# Patient Record
Sex: Male | Born: 1945 | Race: White | Hispanic: No
Health system: Southern US, Community
[De-identification: ages and names within clinical notes are randomized; demographics above are authoritative.]

## PROBLEM LIST (undated history)

## (undated) DIAGNOSIS — F1910 Other psychoactive substance abuse, uncomplicated: Secondary | ICD-10-CM

## (undated) DIAGNOSIS — I10 Essential (primary) hypertension: Secondary | ICD-10-CM

## (undated) DIAGNOSIS — J690 Pneumonitis due to inhalation of food and vomit: Principal | ICD-10-CM

---

## 2016-07-20 DIAGNOSIS — J690 Pneumonitis due to inhalation of food and vomit: Secondary | ICD-10-CM | POA: Diagnosis present

## 2016-07-20 HISTORY — DX: Pneumonitis due to inhalation of food and vomit: J69.0

## 2016-08-16 HISTORY — PX: PICC LINE PLACE PERIPHERAL (ARMC HX): HXRAD1248

## 2016-09-08 ENCOUNTER — Encounter: Payer: Self-pay | Admitting: Physician Assistant

## 2016-09-08 ENCOUNTER — Inpatient Hospital Stay
Admission: RE | Admit: 2016-09-08 | Discharge: 2016-10-11 | Disposition: A | Discharge status: Home Health | Payer: Self-pay | Attending: Internal Medicine | Admitting: Internal Medicine

## 2016-09-08 ENCOUNTER — Other Ambulatory Visit (HOSPITAL_COMMUNITY): Payer: Self-pay

## 2016-09-08 DIAGNOSIS — F1910 Other psychoactive substance abuse, uncomplicated: Secondary | ICD-10-CM | POA: Diagnosis present

## 2016-09-08 DIAGNOSIS — Z0189 Encounter for other specified special examinations: Secondary | ICD-10-CM

## 2016-09-08 DIAGNOSIS — I10 Essential (primary) hypertension: Secondary | ICD-10-CM | POA: Diagnosis present

## 2016-09-08 DIAGNOSIS — R001 Bradycardia, unspecified: Secondary | ICD-10-CM | POA: Diagnosis present

## 2016-09-08 DIAGNOSIS — R1310 Dysphagia, unspecified: Secondary | ICD-10-CM

## 2016-09-08 DIAGNOSIS — Z431 Encounter for attention to gastrostomy: Secondary | ICD-10-CM

## 2016-09-08 DIAGNOSIS — J9690 Respiratory failure, unspecified, unspecified whether with hypoxia or hypercapnia: Secondary | ICD-10-CM

## 2016-09-08 DIAGNOSIS — J690 Pneumonitis due to inhalation of food and vomit: Secondary | ICD-10-CM | POA: Diagnosis present

## 2016-09-08 HISTORY — DX: Pneumonitis due to inhalation of food and vomit: J69.0

## 2016-09-08 HISTORY — DX: Essential (primary) hypertension: I10

## 2016-09-08 HISTORY — DX: Other psychoactive substance abuse, uncomplicated: F19.10

## 2016-09-08 LAB — URINALYSIS, ROUTINE W REFLEX MICROSCOPIC
Bilirubin Urine: NEGATIVE
Glucose, UA: NEGATIVE mg/dL
Hgb urine dipstick: NEGATIVE
Ketones, ur: NEGATIVE mg/dL
Leukocytes, UA: NEGATIVE
Nitrite: NEGATIVE
PROTEIN: 30 mg/dL — AB
SQUAMOUS EPITHELIAL / LPF: NONE SEEN
Specific Gravity, Urine: 1.02 (ref 1.005–1.030)
pH: 5 (ref 5.0–8.0)

## 2016-09-08 LAB — BLOOD GAS, ARTERIAL
Acid-base deficit: 0.7 mmol/L (ref 0.0–2.0)
BICARBONATE: 23.5 mmol/L (ref 20.0–28.0)
Drawn by: 244851
FIO2: 40
LHR: 14 {breaths}/min
O2 SAT: 97 %
PATIENT TEMPERATURE: 98.6
PCO2 ART: 39.4 mmHg (ref 32.0–48.0)
PEEP: 5 cmH2O
PH ART: 7.393 (ref 7.350–7.450)
PO2 ART: 93.6 mmHg (ref 83.0–108.0)
VT: 500 mL

## 2016-09-08 LAB — VANCOMYCIN, RANDOM: VANCOMYCIN RM: 25

## 2016-09-08 NOTE — Consult Note (Signed)
CARDIOLOGY CONSULT NOTE   Patient ID: Samuel MantisCarl Baldwin MRN: [id number] DOB/AGE: 70/09/1945 70 y.o.  Admit date: 09/08/2016  Primary Physician   Pcp Not In System Primary Cardiologist   None Reason for Consultation   Bradycardia Requesting MD: Dr Sharyon MedicusHijazi  YQM:VHQIHPI:Samuel Baldwin is a 70 y.o. year old male with a history of HTN  [facility]/27-12/21 for acute resp failure, CAP, polysubstance abuse, delirium and acute renal failure.   Pt had been on methadone and Percocet. A friend called EMS for altered mental status. Pt was initially combative and agitated. UDS + for methamphetamine, amphetamines, THC, and benzodiazepines. He has also had jimson weed. He required intubation for airway protection and respiratory failure.  He had SVT with multiple doses of Adenosine. CK 528, LLL PNA on CXR. His heart rate eventually slowed and he was felt to have atrial tachycardia.   On mechanical ventilation, he was difficult to sedate, switched from propofol to Versed. He had bradycardia with Precedex (twice). Zyprexa was tried to wean his sedation. His EF was normal by echo. He was initially hypotensive, but this improved. He was diuresed, but still with moderately high O2 requirements. UOP was improving, as was renal function. He had bronchoscopy for mucous plugging, with improvement.  His mental status did not improve quickly, he was felt to have a reaction to jimson weed.   He had junctional bradycardia, felt to be sedation-related. He was extubated for a time, and his heart rate improved to >60. However, he had to be re-intubated, and the junctional bradycardia returned. Low-dose dopamine was used to maintain HR. Isuprel was considered, but not needed.   He was treated for PNA, but he was cultured when his fever returned. Culture was + MRSA. PEG not placed due to the MRSA. Cardiology followed Samuel Baldwin at [facility] and did not wish to do a PPM since he was still having fevers at  times. He was maintaining his BP with the bradycardia and his heart rate would improve when off sedation.  [facility]. He is still sedated on the vent and cannot provide information. His heart rate is junctional bradycardia in the 30s at times.   Per cardiology note 12/08, his ECG 11/27 "looks like atrial tachycardia at about 160 bpm with variable AV conduction such that ventricular rate was in the 90s - this is the EKG on Nov 27 at 0132."  While intubated, he had "marked sinus bradycardia with junctional rhythm. His BP was normal to elevated during this time, so it was not hemodynamically significant." no PPM indicated, rebound from prior substance abuse considered.    Past Medical History:  Diagnosis Date  . Aspiration pneumonia (HCC) 07/2016  . HTN (hypertension)   . Polysubstance abuse    methamphetamine, amphetamines, THC, benzodiazepine and jimson week     Past Surgical History:  Procedure Laterality Date  . PICC LINE PLACE PERIPHERAL ([facility] HX)  08/16/2016    Allergies no known allergies  I have reviewed the patient's current medications Insulin per protocol Oxycodone IR 15 MG Q 6 HR PRN Ativan 2 mg IV prn Vancomycin Thiamine 100 mg qd Valium 5 mg bid Colace 100 mg qd Phenobarbital 32.4 mg bid Pepcid 20 mg bid Lasix 20 mg bid Lovenox 40 mg subcu qd  Information on PTA meds, social history, etcetera not available due to pt condition.  Prior to Admission medications   Not known.  Social History   Social History  . Marital status: Significant Other    Spouse name: N/A  . Number of children: N/A  . Years of education: N/A   Occupational History  . Not on file.   Social History Main Topics  . Smoking status: Not on file  . Smokeless tobacco: Not on file  . Alcohol use Not on file  . Drug use: Unknown  . Sexual activity: Not on file   Other Topics Concern  . Not on file   Social History Narrative  . No narrative on file      No family status information on file.   No family history on file. not elicitable from patient and [facility]     Labs: ABG    Component Value Date/Time   PHART 7.393 09/08/2016 1200   PCO2ART 39.4 09/08/2016 1200   PO2ART 93.6 09/08/2016 1200   HCO3 23.5 09/08/2016 1200   ACIDBASEDEF 0.7 09/08/2016 1200   O2SAT 97.0 09/08/2016 1200   Urinalysis    Component Value Date/Time   COLORURINE YELLOW 09/08/2016 1415   APPEARANCEUR HAZY (A) 09/08/2016 1415   LABSPEC 1.020 09/08/2016 1415   PHURINE 5.0 09/08/2016 1415   GLUCOSEU NEGATIVE 09/08/2016 1415   HGBUR NEGATIVE 09/08/2016 1415   BILIRUBINUR NEGATIVE 09/08/2016 1415   KETONESUR NEGATIVE 09/08/2016 1415   PROTEINUR 30 (A) 09/08/2016 1415   NITRITE NEGATIVE 09/08/2016 1415   LEUKOCYTESUR NEGATIVE 09/08/2016 1415     Echo: 08/15/2016 Interpretation Summary A limited portable two-dimensional transthoracic echocardiogram with color Doppler and Spectral Doppler was performed. The left ventricle is normal in size. There is mild concentric left ventricular hypertrophy. The left ventricular wall motion is normal. The left ventricular ejection fraction is normal (55-60%). Moderate aortic sclerosis is present with good valvular opening. The left atrium is mildly dilated. Unable to adequately determine diastolic dysfunction.  ECG:  09/07/2016 Ventricular Rate 36  Atrial Rate 36  P-R Interval 1686  QRS Duration 83  Q-T Interval 579  QTC Calculation(Bezet) 448  Calculated R Axis 66  Calculated T Axis 56   Diagnosis: Junctional rhythm   Cath: n/a  Radiology:  Dg Chest Port 1 View Result Date: 09/08/2016 CLINICAL DATA:  Tracheostomy tube placement EXAM: PORTABLE CHEST 1 VIEW COMPARISON:  None. FINDINGS: Tracheostomy tube with the tip 8 cm above the carina. Nasogastric tube with the distal portion not visualized. Bilateral interstitial thickening. Stable cardiomegaly. No pleural effusion or  pneumothorax. IMPRESSION: 1. Tracheostomy tube in satisfactory position. 2. Cardiomegaly with pulmonary vascular congestion. Electronically Signed   By: Elige KoHetal  Patel   On: 09/08/2016 13:38   Dg Abd Portable 1v Result Date: 09/08/2016 CLINICAL DATA:  NG tube placement EXAM: PORTABLE ABDOMEN - 1 VIEW COMPARISON:  None. FINDINGS: NG tube is in the proximal stomach.  Normal bowel gas pattern. IMPRESSION: NG tube in the proximal stomach. Electronically Signed   By: Marlan Palauharles  Clark M.D.   On: 09/08/2016 13:55    ASSESSMENT AND PLAN:   The patient was seen today by Dr Eden EmmsNishan, the patient evaluated and the data reviewed.   1. Bradycardia: This is not symptomatic notes from [facility] indicate no change with Dopamine and rhythm worse when sedated Given infection and ongoing need for antibiotics as well as altered MS he is not a candidate for PPM. Observe on telemetry For now. Try to minimize sedation check TSH/BMET. He has normal EF and no signs of active ischemia.    Charlton HawsPeter Izaac Reisig

## 2016-09-09 LAB — COMPREHENSIVE METABOLIC PANEL
ALBUMIN: 2.8 g/dL — AB (ref 3.5–5.0)
ALT: 35 U/L (ref 17–63)
AST: 26 U/L (ref 15–41)
Alkaline Phosphatase: 97 U/L (ref 38–126)
Anion gap: 9 (ref 5–15)
BUN: 35 mg/dL — AB (ref 6–20)
CHLORIDE: 104 mmol/L (ref 101–111)
CO2: 26 mmol/L (ref 22–32)
Calcium: 9.2 mg/dL (ref 8.9–10.3)
Creatinine, Ser: 1.02 mg/dL (ref 0.61–1.24)
GFR calc Af Amer: >60 mL/min (ref >60)
GFR calc non Af Amer: >60 mL/min (ref >60)
Glucose, Bld: 264 mg/dL — ABNORMAL HIGH (ref 65–99)
POTASSIUM: 4.1 mmol/L (ref 3.5–5.1)
SODIUM: 139 mmol/L (ref 135–145)
Total Bilirubin: 0.8 mg/dL (ref 0.3–1.2)
Total Protein: 7 g/dL (ref 6.5–8.1)

## 2016-09-09 LAB — PROTIME-INR
INR: 1.19
PROTHROMBIN TIME: 15.2 s (ref 11.4–15.2)

## 2016-09-09 LAB — CBC WITH DIFFERENTIAL/PLATELET
BASOS ABS: 0.1 10*3/uL (ref 0.0–0.1)
BASOS PCT: 1 %
EOS ABS: 0.3 10*3/uL (ref 0.0–0.7)
Eosinophils Relative: 2 %
HEMATOCRIT: 35.5 % — AB (ref 39.0–52.0)
HEMOGLOBIN: 11.9 g/dL — AB (ref 13.0–17.0)
Lymphocytes Relative: 9 %
Lymphs Abs: 1.3 10*3/uL (ref 0.7–4.0)
MCH: 29.8 pg (ref 26.0–34.0)
MCHC: 33.5 g/dL (ref 30.0–36.0)
MCV: 88.8 fL (ref 78.0–100.0)
MONO ABS: 0.8 10*3/uL (ref 0.1–1.0)
Monocytes Relative: 6 %
NEUTROS ABS: 11.4 10*3/uL — AB (ref 1.7–7.7)
NEUTROS PCT: 82 %
Platelets: 234 10*3/uL (ref 150–400)
RBC: 4 MIL/uL — ABNORMAL LOW (ref 4.22–5.81)
RDW: 12.9 % (ref 11.5–15.5)
WBC: 13.8 10*3/uL — ABNORMAL HIGH (ref 4.0–10.5)

## 2016-09-09 LAB — URINE CULTURE: CULTURE: NO GROWTH

## 2016-09-09 LAB — TSH: TSH: 0.366 u[IU]/mL (ref 0.350–4.500)

## 2016-09-09 LAB — PHOSPHORUS: Phosphorus: 2.9 mg/dL (ref 2.5–4.6)

## 2016-09-09 LAB — MAGNESIUM: Magnesium: 2 mg/dL (ref 1.7–2.4)

## 2016-09-09 LAB — VANCOMYCIN, TROUGH: VANCOMYCIN TR: 7 ug/mL — AB (ref 15–20)

## 2016-09-09 NOTE — Progress Notes (Signed)
Patient Name: Samuel Baldwin Date of Encounter: 09/09/2016  Primary Cardiologist: Dr [facility]  Hospital Problem List     Principal Problem:   Aspiration pneumonia [facility](HCC) Active Problems:   Polysubstance abuse   HTN (hypertension)   Bradycardia with 31-40 beats per minute     Subjective   Pt unresponsive on the vent. Agitated much of the time, hard to sedate.  Inpatient Medications    Scheduled Meds:  Continuous Infusions: Dopamine, titrated to keep HR > 60  PRN Meds:  Insulin, K+ and Mg per protocol  Vital Signs    176/86, HR 72, respirations 20, O2 sats 99%; I/O 1120/2200  Physical Exam    GEN: Well nourished, well developed, male agitated on the vent  HEENT: Grossly normal.  Neck: Supple, no JVD seen, carotid bruits, or masses. Cardiac: RRR, no murmurs, rubs, or gallops. No clubbing, cyanosis, edema.  Radials/DP/PT 2+ and equal bilaterally.  Respiratory:  Respirations regular and unlabored, bilateral rales and rhonchi GI: Soft, nontender, nondistended, BS + x 4. MS: no deformity or atrophy. Skin: warm and dry, no rash. Neuro:  Strength intact.  Labs    CBC  Recent Labs  09/09/16 0531  WBC 13.8*  NEUTROABS 11.4*  HGB 11.9*  HCT 35.5*  MCV 88.8  PLT 234   Basic Metabolic Panel  Recent Labs  09/09/16 0531  NA 139  K 4.1  CL 104  CO2 26  GLUCOSE 264*  BUN 35*  CREATININE 1.02  CALCIUM 9.2  MG 2.0  PHOS 2.9   Liver Function Tests  Recent Labs  09/09/16 0531  AST 26  ALT 35  ALKPHOS 97  BILITOT 0.8  PROT 7.0  ALBUMIN 2.8*   Thyroid Function Tests  Recent Labs  09/09/16 0531  TSH 0.366    Telemetry    SR, mild sinus brady - Personally Reviewed  ECG    n/a - Personally Reviewed  Radiology    Dg Chest Port 1 View  Result Date: 09/08/2016 CLINICAL DATA:  Tracheostomy tube placement EXAM: PORTABLE CHEST 1 VIEW COMPARISON:  None. FINDINGS: Tracheostomy tube with the tip 8 cm above the carina. Nasogastric tube with the  distal portion not visualized. Bilateral interstitial thickening. Stable cardiomegaly. No pleural effusion or pneumothorax. IMPRESSION: 1. Tracheostomy tube in satisfactory position. 2. Cardiomegaly with pulmonary vascular congestion. Electronically Signed   By: Samuel KoHetal  Baldwin   On: 09/08/2016 13:38   Dg Abd Portable 1v  Result Date: 09/08/2016 CLINICAL DATA:  NG tube placement EXAM: PORTABLE ABDOMEN - 1 VIEW COMPARISON:  None. FINDINGS: NG tube is in the proximal stomach.  Normal bowel gas pattern. IMPRESSION: NG tube in the proximal stomach. Electronically Signed   By: Samuel Palauharles  Clark M.D.   On: 09/08/2016 13:55    Cardiac Studies   None here  Patient Profile     70 yo male w/ hx HTN and polysubstance abuse [facility] for OD on multiple drugs including jimson weed (felt responsible for his agitation). MRSA PNA, VDRF, now trached. Bradycardia during entire hospitalization when sedated, but sedation is necessary. Managed by Dopa gtt.  Assessment & Plan    1. VDRF with agitation: per Primary MD, still requires significant sedation  2. Bradycardia:  - Continue Dopa prn to maintain HR. Current protocol is to keep HR at 60. - Pt has maintained BP even when HR was in the 30s, no indication for PPM.  - not on any rate-lowering rx - hx ?atrial tach vs SVT at  [facility], rx with Adenosine, none here. - MD advise on changing parameters to use Dopa only for HR <45 to limit its effect on BP  3. HTN:  - SBP very high today, (199 at one point) worse when he is agitated, when staff is moving him to care for him - Dopamine is probably contributing - cannot use metoprolol or labetalol - MD advise on scheduled or prn hydralazine   Signed, Samuel Baldwin, Samuel LoserRhonda, PA-C  09/09/2016, 9:50 AM   Patient is asymptomatic even with HR 35 reading [facility] this has been like this for weeks Their EP service declined pacer as well would not use dopamine unless HR sustained below 35 or  symptomatic  Samuel Baldwin

## 2016-09-10 LAB — VANCOMYCIN, TROUGH: Vancomycin Tr: 16 ug/mL (ref 15–20)

## 2016-09-10 LAB — HEMOGLOBIN A1C
HEMOGLOBIN A1C: 7.2 % — AB (ref 4.8–5.6)
Mean Plasma Glucose: 160 mg/dL

## 2016-09-10 NOTE — Progress Notes (Signed)
   Patient Name: Samuel MantisCarl Baldwin Date of Encounter: 09/10/2016  [facility]  Hospital Problem List     Principal Problem:   Aspiration pneumonia [facility](HCC) Active Problems:   Polysubstance abuse   HTN (hypertension)   Bradycardia with 31-40 beats per minute     Subjective   Pt not interactive, on the vent.   Inpatient Medications    Scheduled Meds:  Continuous Infusions: Dopamine, titrated to keep HR > 60  PRN Meds:  Insulin, K+ and Mg per protocol  Vital Signs    reviewed  Physical Exam    GEN: Well nourished, well developed, not very interactive but awake HEENT: Grossly normal.  Neck: Supple, no JVD seen, carotid bruits, or masses. Cardiac: RRR, no murmurs, rubs, or gallops. No clubbing, cyanosis, edema.  Radials/DP/PT 2+ and equal bilaterally.  Respiratory:  Respirations regular and unlabored, bilateral rales and rhonchi GI: Soft, nontender, nondistended, BS + x 4. MS: no deformity or atrophy. Skin: warm and dry, no rash. Neuro:  Strength intact.  Labs    CBC  Recent Labs  09/09/16 0531  WBC 13.8*  NEUTROABS 11.4*  HGB 11.9*  HCT 35.5*  MCV 88.8  PLT 234   Basic Metabolic Panel  Recent Labs  09/09/16 0531  NA 139  K 4.1  CL 104  CO2 26  GLUCOSE 264*  BUN 35*  CREATININE 1.02  CALCIUM 9.2  MG 2.0  PHOS 2.9   Liver Function Tests  Recent Labs  09/09/16 0531  AST 26  ALT 35  ALKPHOS 97  BILITOT 0.8  PROT 7.0  ALBUMIN 2.8*   Thyroid Function Tests  Recent Labs  09/09/16 0531  TSH 0.366    Telemetry    HR 60  Radiology    Dg Chest Port 1 View  Result Date: 09/08/2016 CLINICAL DATA:  Tracheostomy tube placement EXAM: PORTABLE CHEST 1 VIEW COMPARISON:  None. FINDINGS: Tracheostomy tube with the tip 8 cm above the carina. Nasogastric tube with the distal portion not visualized. Bilateral interstitial thickening. Stable cardiomegaly. No pleural effusion or pneumothorax. IMPRESSION: 1. Tracheostomy tube in  satisfactory position. 2. Cardiomegaly with pulmonary vascular congestion. Electronically Signed   By: Elige KoHetal  Patel   On: 09/08/2016 13:38   Dg Abd Portable 1v  Result Date: 09/08/2016 CLINICAL DATA:  NG tube placement EXAM: PORTABLE ABDOMEN - 1 VIEW COMPARISON:  None. FINDINGS: NG tube is in the proximal stomach.  Normal bowel gas pattern. IMPRESSION: NG tube in the proximal stomach. Electronically Signed   By: Marlan Palauharles  Clark M.D.   On: 09/08/2016 13:55    Patient Profile     [age] yo male w/ hx HTN and polysubstance abuse tx from [facility] for OD on multiple drugs including jimson weed (felt responsible for his agitation). MRSA PNA, VDRF, now trached. Bradycardia during entire hospitalization when sedated, but sedation is necessary. Managed by Dopa gtt.  Assessment & Plan    1. VDRF with agitation: per Primary MD   2. Bradycardia:  Asymptomatic Would wean dobutamine/ dopamine to off Unless prolonged pauses or symptoms with bradycardia, would not advise interventions. Agree with Dr Eden EmmsNishan that currently he is a poor candidate for PPM given infections.  Hopefully this can be avoided all together.  3. HTN:  Avoid chrono depressants.   Cardiology team to see as needed over the weekend. Please call with questions.  Hillis RangeJames Burke Terry MD, [facility] 09/10/2016 12:36 PM

## 2016-09-12 ENCOUNTER — Other Ambulatory Visit (HOSPITAL_COMMUNITY): Payer: Self-pay

## 2016-09-12 LAB — BASIC METABOLIC PANEL
ANION GAP: 8 (ref 5–15)
BUN: 20 mg/dL (ref 6–20)
CALCIUM: 9 mg/dL (ref 8.9–10.3)
CO2: 32 mmol/L (ref 22–32)
Chloride: 98 mmol/L — ABNORMAL LOW (ref 101–111)
Creatinine, Ser: 1 mg/dL (ref 0.61–1.24)
GFR calc Af Amer: >60 mL/min (ref >60)
GFR calc non Af Amer: >60 mL/min (ref >60)
GLUCOSE: 170 mg/dL — AB (ref 65–99)
POTASSIUM: 4 mmol/L (ref 3.5–5.1)
Sodium: 138 mmol/L (ref 135–145)

## 2016-09-13 ENCOUNTER — Other Ambulatory Visit (HOSPITAL_COMMUNITY): Payer: Self-pay

## 2016-09-13 LAB — CBC
HCT: 30.8 % — ABNORMAL LOW (ref 39.0–52.0)
Hemoglobin: 9.8 g/dL — ABNORMAL LOW (ref 13.0–17.0)
MCH: 28.5 pg (ref 26.0–34.0)
MCHC: 31.8 g/dL (ref 30.0–36.0)
MCV: 89.5 fL (ref 78.0–100.0)
PLATELETS: 225 10*3/uL (ref 150–400)
RBC: 3.44 MIL/uL — AB (ref 4.22–5.81)
RDW: 13.2 % (ref 11.5–15.5)
WBC: 6.5 10*3/uL (ref 4.0–10.5)

## 2016-09-13 LAB — BASIC METABOLIC PANEL
Anion gap: 10 (ref 5–15)
BUN: 21 mg/dL — AB (ref 6–20)
CO2: 33 mmol/L — ABNORMAL HIGH (ref 22–32)
CREATININE: 1.04 mg/dL (ref 0.61–1.24)
Calcium: 9 mg/dL (ref 8.9–10.3)
Chloride: 95 mmol/L — ABNORMAL LOW (ref 101–111)
Glucose, Bld: 142 mg/dL — ABNORMAL HIGH (ref 65–99)
POTASSIUM: 3.5 mmol/L (ref 3.5–5.1)
SODIUM: 138 mmol/L (ref 135–145)

## 2016-09-14 ENCOUNTER — Other Ambulatory Visit (HOSPITAL_COMMUNITY): Payer: Self-pay

## 2016-09-14 DIAGNOSIS — I10 Essential (primary) hypertension: Secondary | ICD-10-CM

## 2016-09-14 DIAGNOSIS — J690 Pneumonitis due to inhalation of food and vomit: Secondary | ICD-10-CM

## 2016-09-14 NOTE — Consult Note (Signed)
Chief Complaint: Patient was seen in consultation today for percutaneous gastric tube placement at the request of Dr Ardeth SportsmanA Hijazi  Referring Physician(s): Dr Ardeth SportsmanA Hijazi  Supervising Physician: Malachy MoanMcCullough, Heath  [facility]  History of Present Illness: Myrtha MantisCarl Wohlers is a 70 y.o. male   Aspiration Pna- Resp failure On vent/trach  Protein calorie malnutrition delerium- deconditioning Dysphagia Need for long term care Request for percutaneous gastric tube placement CT pending Can move forward dependent on CT findings   Past Medical History:  Diagnosis Date  . Aspiration pneumonia (HCC) 07/2016  . HTN (hypertension)   . Polysubstance abuse    methamphetamine, amphetamines, THC, benzodiazepine and jimson week    Past Surgical History:  Procedure Laterality Date  . PICC LINE PLACE PERIPHERAL ([facility] HX)  08/16/2016    Allergies: Patient has no known allergies.  Medications: Prior to Admission medications   Not on File     No family history on file.  Social History   Social History  . Marital status: Significant Other    Spouse name: N/A  . Number of children: N/A  . Years of education: N/A   Social History Main Topics  . Smoking status: Not on file  . Smokeless tobacco: Not on file  . Alcohol use Not on file  . Drug use: Unknown  . Sexual activity: Not on file   Other Topics Concern  . Not on file   Social History Narrative  . No narrative on file    Review of Systems: A 12 point ROS discussed and pertinent positives are indicated in the HPI above.  All other systems are negative.  Review of Systems  Respiratory:       Vent/trach    Vital Signs: There were no vitals taken for this visit.  Physical Exam  Constitutional: He appears well-developed.  Cardiovascular: Normal rate, regular rhythm and normal heart sounds.   Pulmonary/Chest: Effort normal. No respiratory distress.  Trach in place  Abdominal: Soft.  Neurological: He is  alert.  Nursing note and vitals reviewed.   Mallampati Score:  MD Evaluation Airway: Other (comments) Airway comments: trach Heart: WNL Abdomen: WNL Chest/ Lungs: WNL ASA  Classification: 3 Mallampati/Airway Score: Three  Imaging: Dg Chest Port 1 View  Result Date: 09/13/2016 CLINICAL DATA:  Respiratory failure EXAM: PORTABLE CHEST 1 VIEW COMPARISON:  Portable chest x-ray of 09/08/2016 FINDINGS: The lungs are not well aerated. No focal infiltrate or effusion is seen. Cardiomegaly is stable. An NG tube is noted but cannot be visualized distally. No bony abnormality is seen. IMPRESSION: Poor inspiration.  No active lung disease.  Stable cardiomegaly Electronically Signed   By: Dwyane DeePaul  Barry M.D.   On: 09/13/2016 15:29   Dg Chest Port 1 View  Result Date: 09/08/2016 CLINICAL DATA:  Tracheostomy tube placement EXAM: PORTABLE CHEST 1 VIEW COMPARISON:  None. FINDINGS: Tracheostomy tube with the tip 8 cm above the carina. Nasogastric tube with the distal portion not visualized. Bilateral interstitial thickening. Stable cardiomegaly. No pleural effusion or pneumothorax. IMPRESSION: 1. Tracheostomy tube in satisfactory position. 2. Cardiomegaly with pulmonary vascular congestion. Electronically Signed   By: Elige KoHetal  Patel   On: 09/08/2016 13:38   Dg Abd Portable 1v  Result Date: 09/08/2016 CLINICAL DATA:  NG tube placement EXAM: PORTABLE ABDOMEN - 1 VIEW COMPARISON:  None. FINDINGS: NG tube is in the proximal stomach.  Normal bowel gas pattern. IMPRESSION: NG tube in the proximal stomach. Electronically Signed   By: Leonette Mostharles  Chestine Sporelark M.D.   On: 09/08/2016 13:55    Labs:  CBC:  Recent Labs  09/09/16 0531 09/13/16 0723  WBC 13.8* 6.5  HGB 11.9* 9.8*  HCT 35.5* 30.8*  PLT 234 225    COAGS:  Recent Labs  09/09/16 0531  INR 1.19    BMP:  Recent Labs  09/09/16 0531 09/12/16 0613 09/13/16 0723  NA 139 138 138  K 4.1 4.0 3.5  CL 104 98* 95*  CO2 26 32 33*  GLUCOSE 264* 170*  142*  BUN 35* 20 21*  CALCIUM 9.2 9.0 9.0  CREATININE 1.02 1.00 1.04  GFRNONAA >60 >60 >60  GFRAA >60 >60 >60    LIVER FUNCTION TESTS:  Recent Labs  09/09/16 0531  BILITOT 0.8  AST 26  ALT 35  ALKPHOS 97  PROT 7.0  ALBUMIN 2.8*    TUMOR MARKERS: No results for input(s): AFPTM, CEA, CA199, CHROMGRNA in the last 8760 hours.  Assessment and Plan:  Asp PNA- resp failure Dysphagia PCM Need for long term care Scheduled for poss perc Gastric tube placement Will await CT findings---evaluate anatomy asap If anatomy amenable to procedure-- will plan for 12/28 in IR  Thank you for this interesting consult.  I greatly enjoyed meeting Myrtha MantisCarl Aaberg and look forward to participating in their care.  A copy of this report was sent to the requesting provider on this date.  Electronically Signed: Ralene MuskratURPIN,Mesiah Manzo A 09/14/2016, 3:29 PM   I spent a total of 40 Minutes    in face to face in clinical consultation, greater than 50% of which was counseling/coordinating care for percutaneous gastric tube placement

## 2016-09-15 ENCOUNTER — Other Ambulatory Visit (HOSPITAL_COMMUNITY): Payer: Self-pay

## 2016-09-15 ENCOUNTER — Encounter (HOSPITAL_COMMUNITY): Payer: Self-pay | Admitting: Interventional Radiology

## 2016-09-15 HISTORY — PX: IR GENERIC HISTORICAL: IMG1180011

## 2016-09-15 LAB — CBC
HEMATOCRIT: 32.1 % — AB (ref 39.0–52.0)
HEMOGLOBIN: 10.2 g/dL — AB (ref 13.0–17.0)
MCH: 28.7 pg (ref 26.0–34.0)
MCHC: 31.8 g/dL (ref 30.0–36.0)
MCV: 90.2 fL (ref 78.0–100.0)
Platelets: 236 10*3/uL (ref 150–400)
RBC: 3.56 MIL/uL — AB (ref 4.22–5.81)
RDW: 13.3 % (ref 11.5–15.5)
WBC: 5.7 10*3/uL (ref 4.0–10.5)

## 2016-09-15 LAB — BASIC METABOLIC PANEL
ANION GAP: 10 (ref 5–15)
BUN: 26 mg/dL — ABNORMAL HIGH (ref 6–20)
CHLORIDE: 97 mmol/L — AB (ref 101–111)
CO2: 32 mmol/L (ref 22–32)
Calcium: 8.8 mg/dL — ABNORMAL LOW (ref 8.9–10.3)
Creatinine, Ser: 1.13 mg/dL (ref 0.61–1.24)
GFR calc non Af Amer: >60 mL/min (ref >60)
Glucose, Bld: 126 mg/dL — ABNORMAL HIGH (ref 65–99)
POTASSIUM: 4 mmol/L (ref 3.5–5.1)
SODIUM: 139 mmol/L (ref 135–145)

## 2016-09-15 LAB — PROTIME-INR
INR: 1.23
PROTHROMBIN TIME: 15.5 s — AB (ref 11.4–15.2)

## 2016-09-15 MED ORDER — FENTANYL CITRATE (PF) 100 MCG/2ML IJ SOLN
INTRAMUSCULAR | Status: AC
  Filled 2016-09-15: qty 2

## 2016-09-15 MED ORDER — FENTANYL CITRATE (PF) 100 MCG/2ML IJ SOLN
INTRAMUSCULAR | Status: AC | PRN
  Administered 2016-09-15: 25 ug via INTRAVENOUS
  Administered 2016-09-15: 50 ug via INTRAVENOUS
  Administered 2016-09-15: 25 ug via INTRAVENOUS
  Administered 2016-09-15: 50 ug via INTRAVENOUS

## 2016-09-15 MED ORDER — MIDAZOLAM HCL 2 MG/2ML IJ SOLN
INTRAMUSCULAR | Status: AC
  Filled 2016-09-15: qty 2

## 2016-09-15 MED ORDER — IOPAMIDOL (ISOVUE-300) INJECTION 61%
INTRAVENOUS | Status: AC
  Administered 2016-09-15: 10 mL
  Filled 2016-09-15: qty 50

## 2016-09-15 MED ORDER — MIDAZOLAM HCL 5 MG/5ML IJ SOLN
INTRAMUSCULAR | Status: AC | PRN
  Administered 2016-09-15: 1 mg via INTRAVENOUS

## 2016-09-15 MED ORDER — LIDOCAINE HCL (PF) 1 % IJ SOLN
INTRAMUSCULAR | Status: AC
  Filled 2016-09-15: qty 30

## 2016-09-15 MED ORDER — GLUCAGON HCL RDNA (DIAGNOSTIC) 1 MG IJ SOLR
INTRAMUSCULAR | Status: AC
  Filled 2016-09-15: qty 1

## 2016-09-15 MED ORDER — GLUCAGON HCL RDNA (DIAGNOSTIC) 1 MG IJ SOLR
INTRAMUSCULAR | Status: AC | PRN
  Administered 2016-09-15: 1 mg via INTRAVENOUS

## 2016-09-15 MED ORDER — MIDAZOLAM HCL 2 MG/2ML IJ SOLN
INTRAMUSCULAR | Status: AC | PRN
  Administered 2016-09-15 (×4): 1 mg via INTRAVENOUS

## 2016-09-15 MED ORDER — LIDOCAINE HCL 1 % IJ SOLN
INTRAMUSCULAR | Status: AC | PRN
  Administered 2016-09-15: 10 mL

## 2016-09-15 NOTE — Sedation Documentation (Addendum)
Patient is restless 

## 2016-09-15 NOTE — Sedation Documentation (Addendum)
Restless And grimacing

## 2016-09-15 NOTE — Sedation Documentation (Signed)
Patient is resting comfortably. 

## 2016-09-15 NOTE — Sedation Documentation (Signed)
Patient restless 

## 2016-09-15 NOTE — Procedures (Signed)
Interventional Radiology Procedure Note  Procedure: Placement of percutaneous 20F pull-through gastrostomy tube. Complications: None Recommendations: - NPO except for sips and chips remainder of today and overnight - Maintain G-tube to LWS until tomorrow morning  - May advance diet as tolerated and begin using tube tomorrow morning  Signed,   Chanette Demo S. Rami Budhu, DO   

## 2016-09-22 ENCOUNTER — Other Ambulatory Visit (HOSPITAL_COMMUNITY): Payer: Self-pay

## 2016-09-22 LAB — CBC
HCT: 41.1 % (ref 39.0–52.0)
Hemoglobin: 13.3 g/dL (ref 13.0–17.0)
MCH: 29.6 pg (ref 26.0–34.0)
MCHC: 32.4 g/dL (ref 30.0–36.0)
MCV: 91.5 fL (ref 78.0–100.0)
PLATELETS: 320 10*3/uL (ref 150–400)
RBC: 4.49 MIL/uL (ref 4.22–5.81)
RDW: 13.6 % (ref 11.5–15.5)
WBC: 9.5 10*3/uL (ref 4.0–10.5)

## 2016-09-22 LAB — BASIC METABOLIC PANEL
Anion gap: 11 (ref 5–15)
BUN: 45 mg/dL — AB (ref 6–20)
CALCIUM: 9 mg/dL (ref 8.9–10.3)
CO2: 33 mmol/L — ABNORMAL HIGH (ref 22–32)
CREATININE: 1.48 mg/dL — AB (ref 0.61–1.24)
Chloride: 101 mmol/L (ref 101–111)
GFR calc Af Amer: 54 mL/min — ABNORMAL LOW (ref >60)
GFR calc non Af Amer: 46 mL/min — ABNORMAL LOW (ref >60)
GLUCOSE: 156 mg/dL — AB (ref 65–99)
Potassium: 3.7 mmol/L (ref 3.5–5.1)
Sodium: 145 mmol/L (ref 135–145)

## 2016-09-27 LAB — CBC
HCT: 35.7 % — ABNORMAL LOW (ref 39.0–52.0)
Hemoglobin: 11.6 g/dL — ABNORMAL LOW (ref 13.0–17.0)
MCH: 29.1 pg (ref 26.0–34.0)
MCHC: 32.5 g/dL (ref 30.0–36.0)
MCV: 89.7 fL (ref 78.0–100.0)
PLATELETS: 240 10*3/uL (ref 150–400)
RBC: 3.98 MIL/uL — AB (ref 4.22–5.81)
RDW: 13.8 % (ref 11.5–15.5)
WBC: 7.9 10*3/uL (ref 4.0–10.5)

## 2016-09-27 LAB — BASIC METABOLIC PANEL
Anion gap: 8 (ref 5–15)
BUN: 19 mg/dL (ref 6–20)
CO2: 29 mmol/L (ref 22–32)
CREATININE: 1.24 mg/dL (ref 0.61–1.24)
Calcium: 8.7 mg/dL — ABNORMAL LOW (ref 8.9–10.3)
Chloride: 101 mmol/L (ref 101–111)
GFR calc Af Amer: >60 mL/min (ref >60)
GFR, EST NON AFRICAN AMERICAN: 57 mL/min — AB (ref >60)
Glucose, Bld: 117 mg/dL — ABNORMAL HIGH (ref 65–99)
POTASSIUM: 3.9 mmol/L (ref 3.5–5.1)
SODIUM: 138 mmol/L (ref 135–145)

## 2016-10-06 LAB — CBC WITH DIFFERENTIAL/PLATELET
Basophils Absolute: 0 10*3/uL (ref 0.0–0.1)
Basophils Relative: 0 %
Eosinophils Absolute: 0.5 10*3/uL (ref 0.0–0.7)
Eosinophils Relative: 5 %
HEMATOCRIT: 32.5 % — AB (ref 39.0–52.0)
Hemoglobin: 10.2 g/dL — ABNORMAL LOW (ref 13.0–17.0)
LYMPHS ABS: 2 10*3/uL (ref 0.7–4.0)
Lymphocytes Relative: 20 %
MCH: 28.3 pg (ref 26.0–34.0)
MCHC: 31.4 g/dL (ref 30.0–36.0)
MCV: 90.3 fL (ref 78.0–100.0)
Monocytes Absolute: 0.4 10*3/uL (ref 0.1–1.0)
Monocytes Relative: 4 %
NEUTROS PCT: 71 %
Neutro Abs: 6.9 10*3/uL (ref 1.7–7.7)
PLATELETS: 257 10*3/uL (ref 150–400)
RBC: 3.6 MIL/uL — ABNORMAL LOW (ref 4.22–5.81)
RDW: 14.3 % (ref 11.5–15.5)
WBC: 9.8 10*3/uL (ref 4.0–10.5)

## 2016-10-06 LAB — RENAL FUNCTION PANEL
Albumin: 2.7 g/dL — ABNORMAL LOW (ref 3.5–5.0)
Anion gap: 10 (ref 5–15)
BUN: 10 mg/dL (ref 6–20)
CHLORIDE: 102 mmol/L (ref 101–111)
CO2: 26 mmol/L (ref 22–32)
Calcium: 8.9 mg/dL (ref 8.9–10.3)
Creatinine, Ser: 0.91 mg/dL (ref 0.61–1.24)
GFR calc Af Amer: >60 mL/min (ref >60)
GFR calc non Af Amer: >60 mL/min (ref >60)
GLUCOSE: 145 mg/dL — AB (ref 65–99)
POTASSIUM: 3.4 mmol/L — AB (ref 3.5–5.1)
Phosphorus: 3.8 mg/dL (ref 2.5–4.6)
Sodium: 138 mmol/L (ref 135–145)

## 2016-10-07 LAB — CBC WITH DIFFERENTIAL/PLATELET
Basophils Absolute: 0.1 10*3/uL (ref 0.0–0.1)
Basophils Relative: 1 %
Eosinophils Absolute: 0.4 10*3/uL (ref 0.0–0.7)
Eosinophils Relative: 4 %
HCT: 34.8 % — ABNORMAL LOW (ref 39.0–52.0)
Hemoglobin: 11.2 g/dL — ABNORMAL LOW (ref 13.0–17.0)
Lymphocytes Relative: 20 %
Lymphs Abs: 2 10*3/uL (ref 0.7–4.0)
MCH: 28.9 pg (ref 26.0–34.0)
MCHC: 32.2 g/dL (ref 30.0–36.0)
MCV: 89.9 fL (ref 78.0–100.0)
Monocytes Absolute: 0.3 10*3/uL (ref 0.1–1.0)
Monocytes Relative: 3 %
Neutro Abs: 7.1 10*3/uL (ref 1.7–7.7)
Neutrophils Relative %: 72 %
Platelets: 286 10*3/uL (ref 150–400)
RBC: 3.87 MIL/uL — ABNORMAL LOW (ref 4.22–5.81)
RDW: 14.5 % (ref 11.5–15.5)
WBC: 10 10*3/uL (ref 4.0–10.5)

## 2016-10-07 LAB — BASIC METABOLIC PANEL
Anion gap: 11 (ref 5–15)
BUN: 12 mg/dL (ref 6–20)
CHLORIDE: 100 mmol/L — AB (ref 101–111)
CO2: 27 mmol/L (ref 22–32)
Calcium: 9.7 mg/dL (ref 8.9–10.3)
Creatinine, Ser: 0.97 mg/dL (ref 0.61–1.24)
GFR calc Af Amer: >60 mL/min (ref >60)
GFR calc non Af Amer: >60 mL/min (ref >60)
Glucose, Bld: 153 mg/dL — ABNORMAL HIGH (ref 65–99)
POTASSIUM: 4.3 mmol/L (ref 3.5–5.1)
SODIUM: 138 mmol/L (ref 135–145)

## 2016-10-07 LAB — MAGNESIUM: MAGNESIUM: 1.8 mg/dL (ref 1.7–2.4)

## 2016-10-07 LAB — PHOSPHORUS: Phosphorus: 3.9 mg/dL (ref 2.5–4.6)

## 2018-01-17 IMAGING — CR DG ABD PORTABLE 1V
1 series · 1 of 1 positions shown · non-contrast
Comparison: None.

CLINICAL DATA: NG tube placement

EXAM:
PORTABLE ABDOMEN - 1 VIEW

[supine ap]
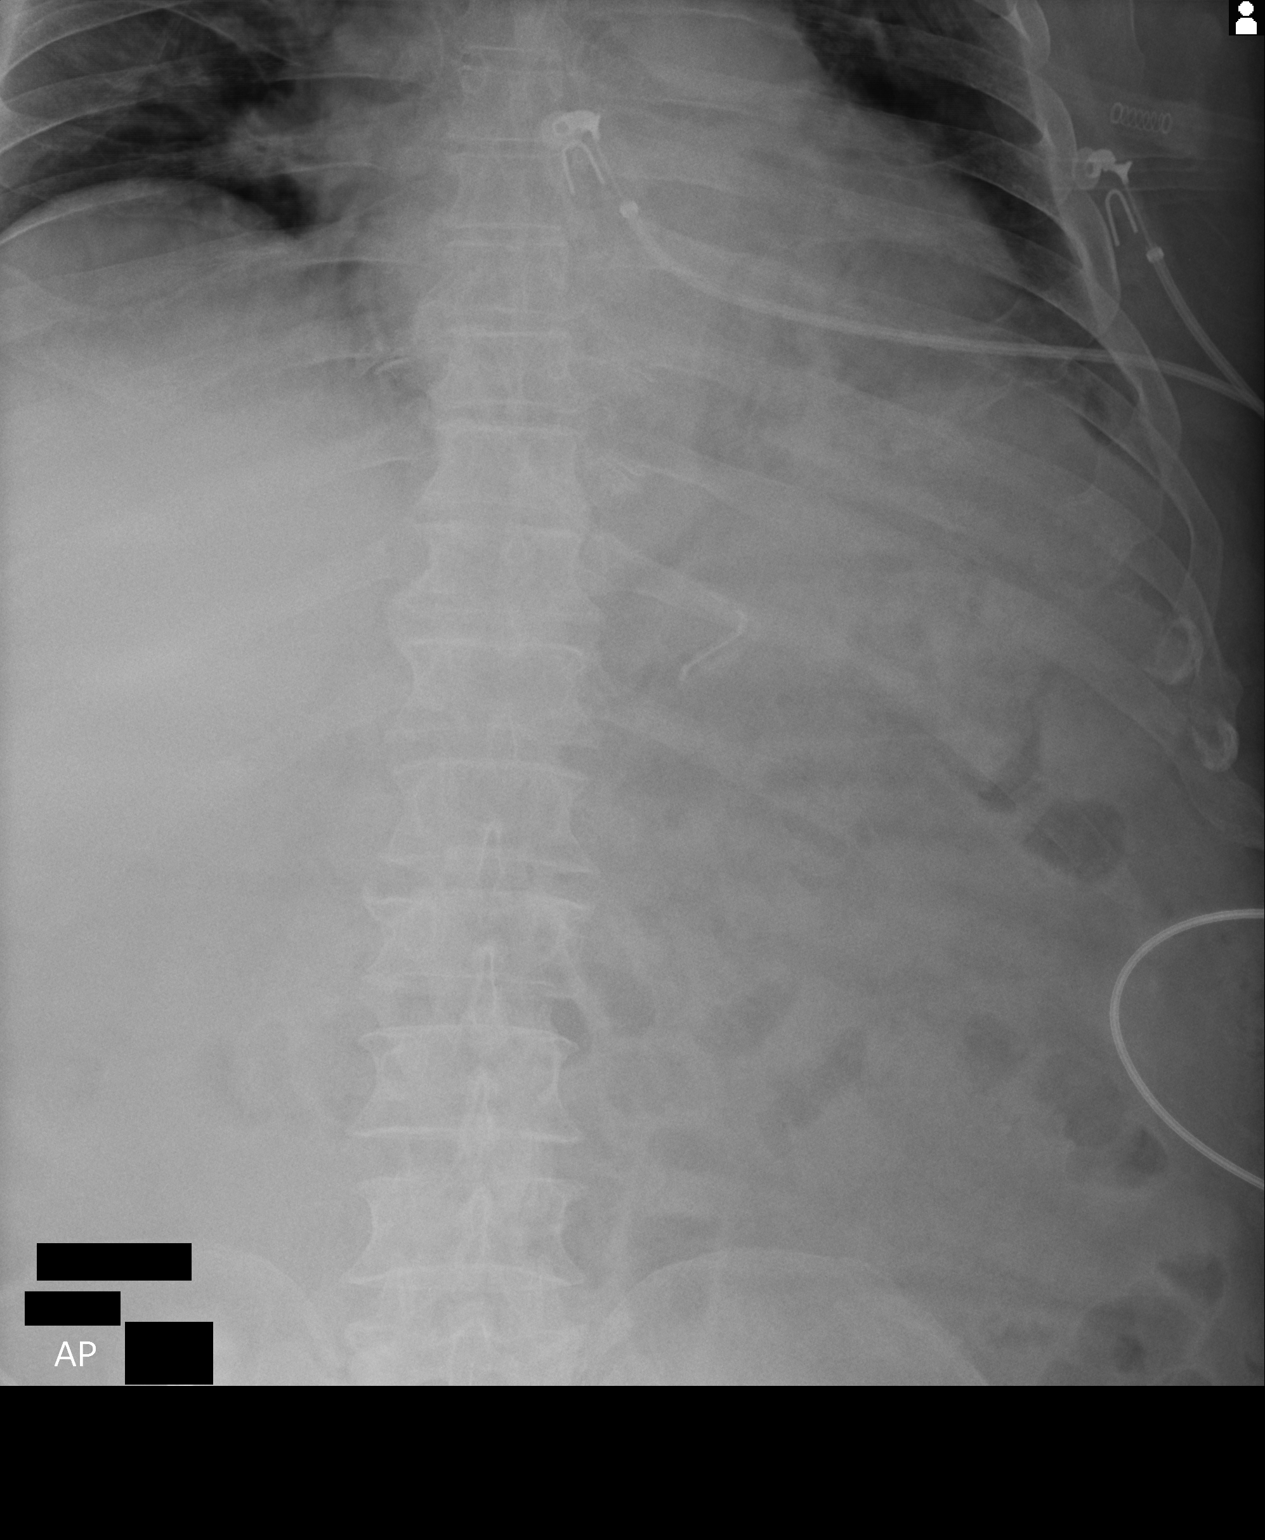

[1 of 1 positions shown; findings below may reference images not displayed]

FINDINGS: NG tube is in the proximal stomach.  Normal bowel gas pattern.
IMPRESSION: NG tube in the proximal stomach.

## 2018-01-22 IMAGING — CR DG CHEST 1V PORT
2 series · 2 of 2 positions shown · non-contrast
Comparison: Portable chest x-ray of 09/08/2016

CLINICAL DATA: Respiratory failure

EXAM:
PORTABLE CHEST 1 VIEW

[AP (1 of 2)]
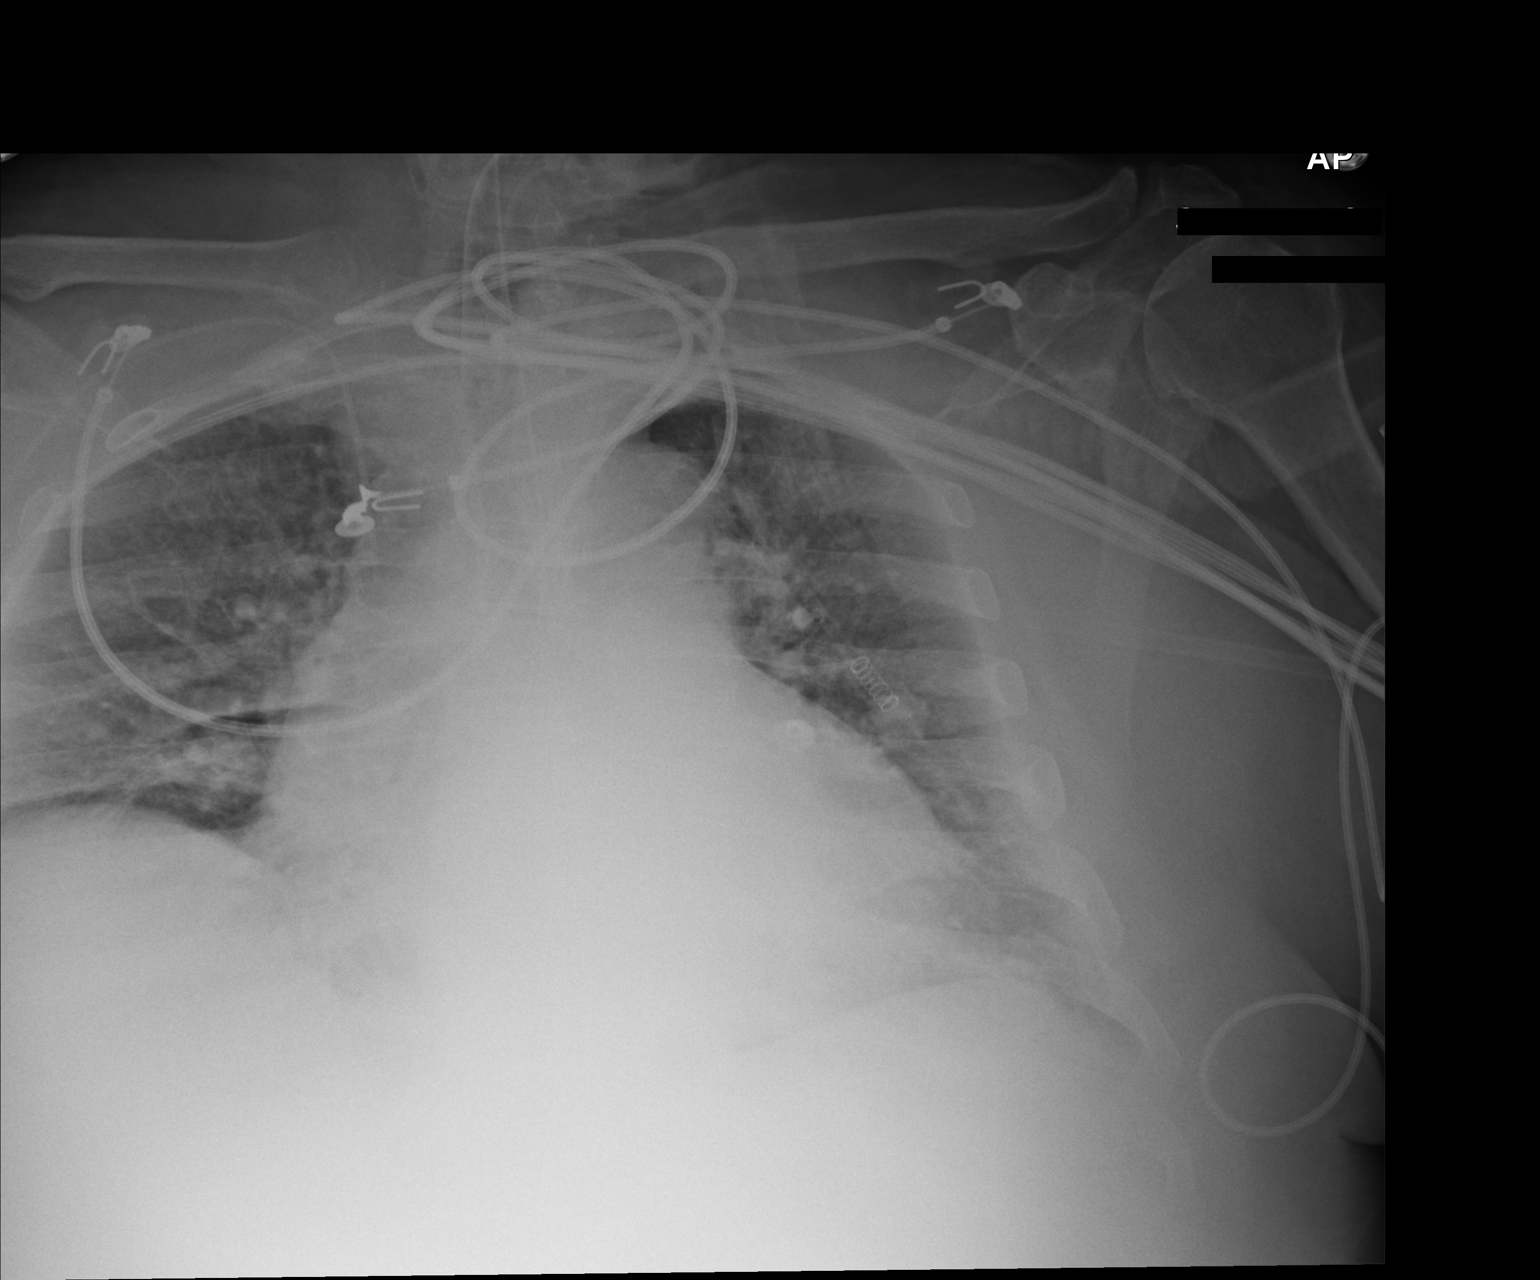

[AP (2 of 2)]
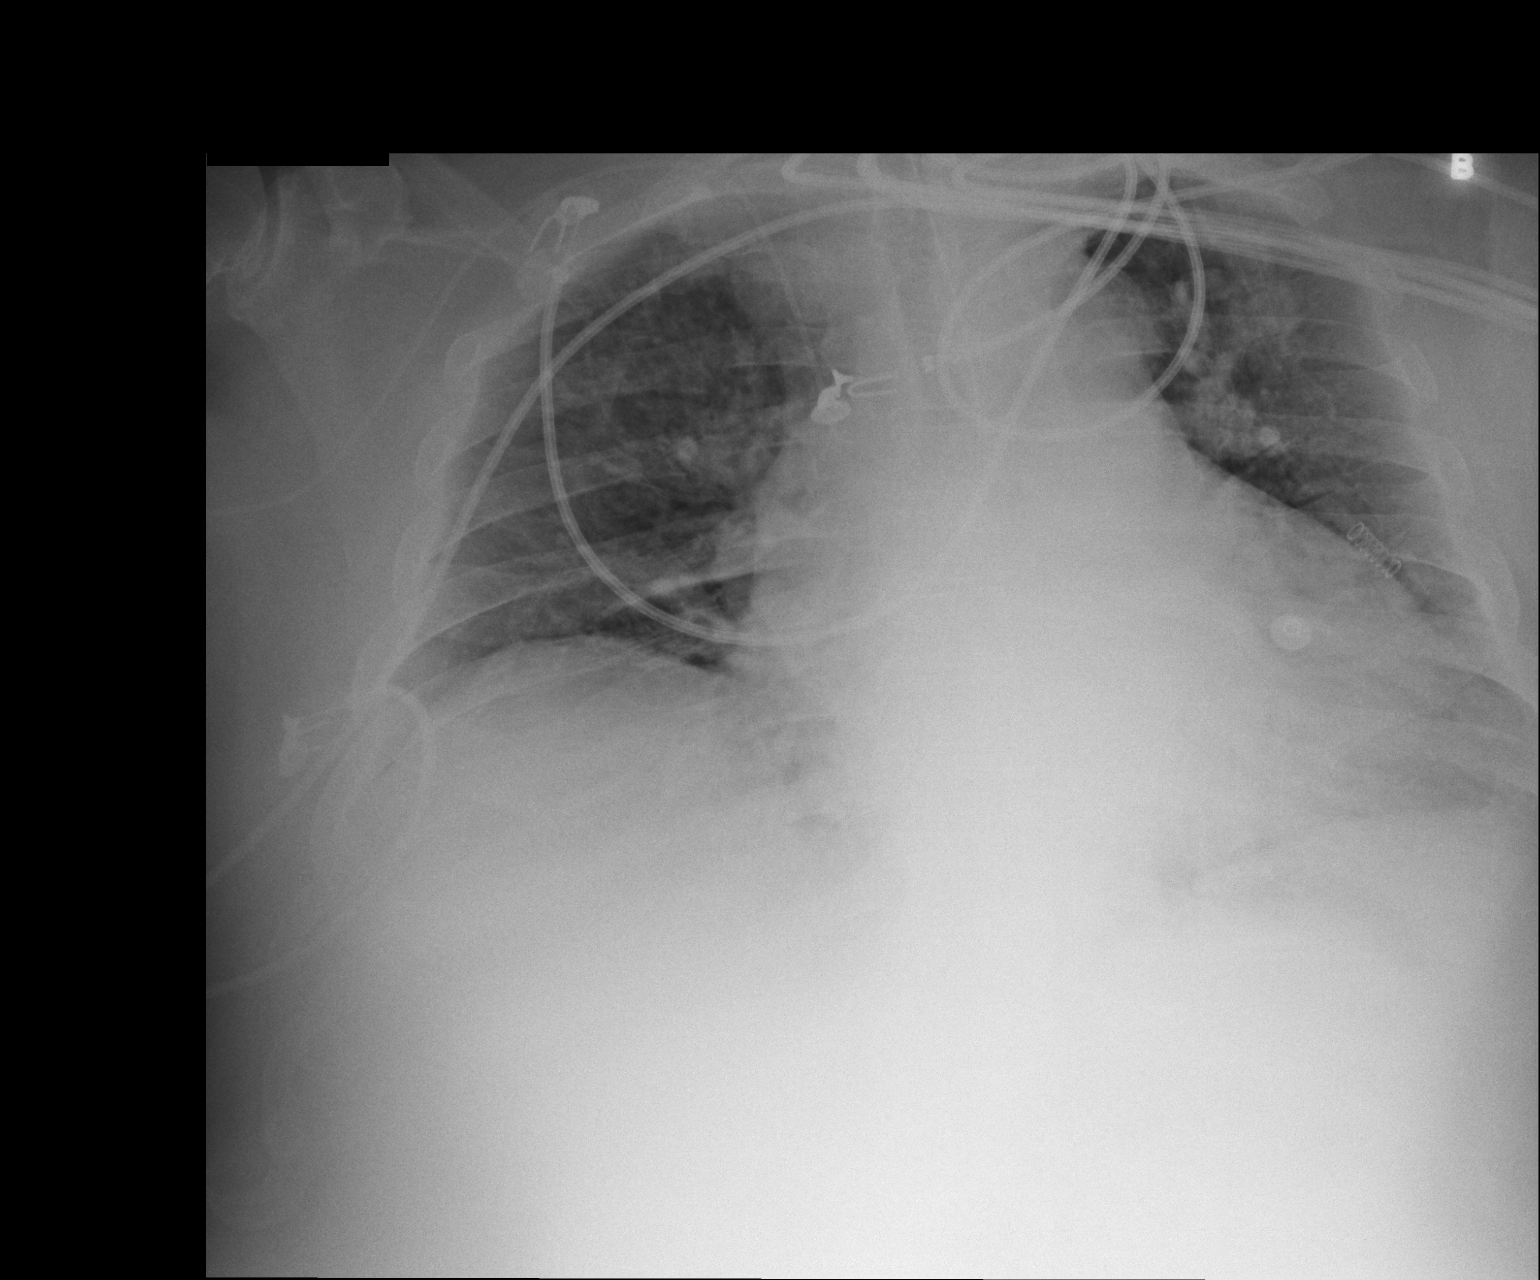

[2 of 2 positions shown; findings below may reference images not displayed]

FINDINGS: The lungs are not well aerated. No focal infiltrate or effusion is
seen. Cardiomegaly is stable. An NG tube is noted but cannot be
visualized distally. No bony abnormality is seen.
IMPRESSION: Poor inspiration.  No active lung disease.  Stable cardiomegaly

## 2018-01-23 IMAGING — CT CT ABDOMEN W/O CM
2 of 4 series · 10 of 46 positions shown, 11 images · non-contrast
Comparison: None.

CLINICAL DATA: Evaluate anatomy for potential percutaneous
gastrostomy tube placement.

EXAM:
CT ABDOMEN WITHOUT CONTRAST
TECHNIQUE: Multidetector CT imaging of the abdomen was performed following the
standard protocol without IV contrast.

[Series 201: routine, idose (2) · axial · 0.98mm/px · z∈[-552,-282]mm · 7 of 67 slices shown, 8 images]
[im 7/67  soft-tissue]
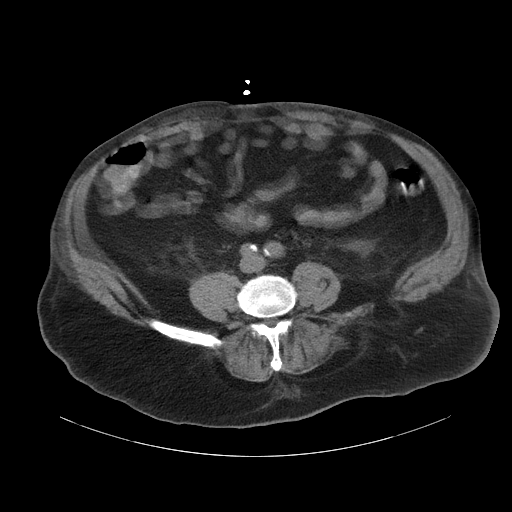
[im 7/67  bone]
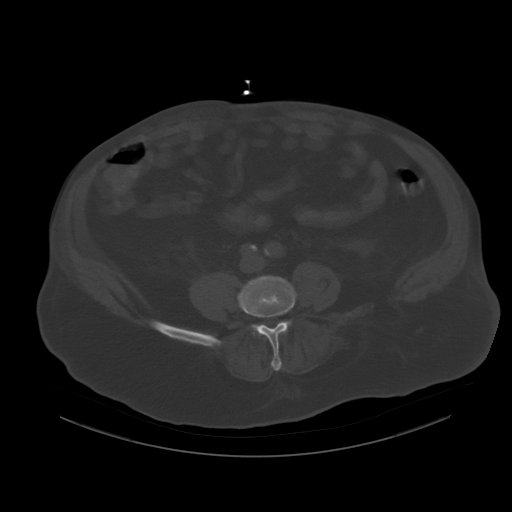
[im 16/67  soft-tissue]
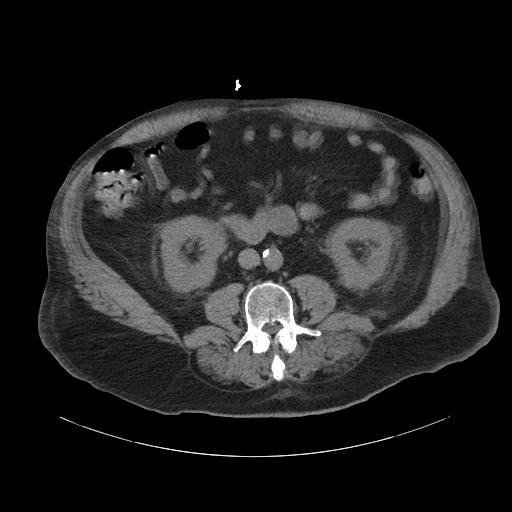
[im 25/67  soft-tissue]
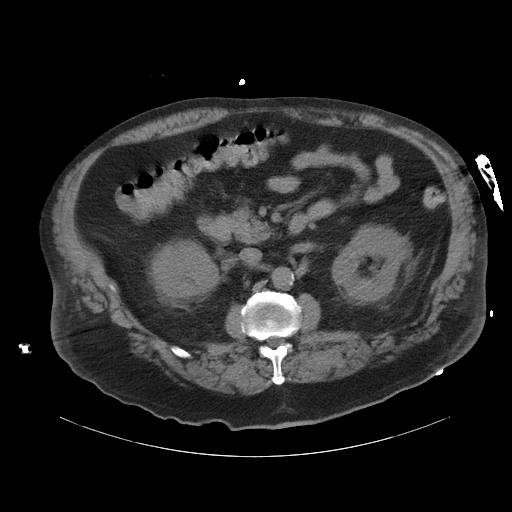
[im 34/67  soft-tissue]
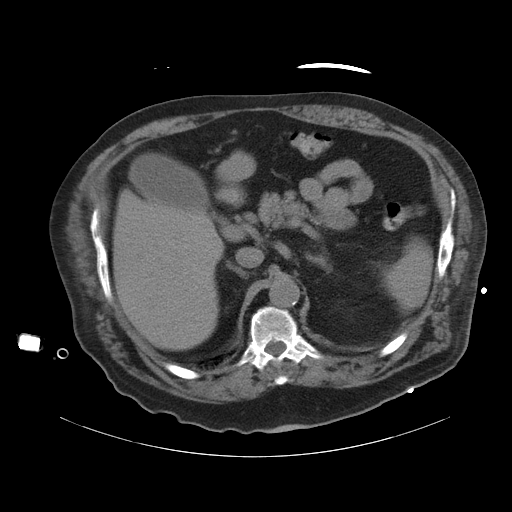
[im 43/67  soft-tissue]
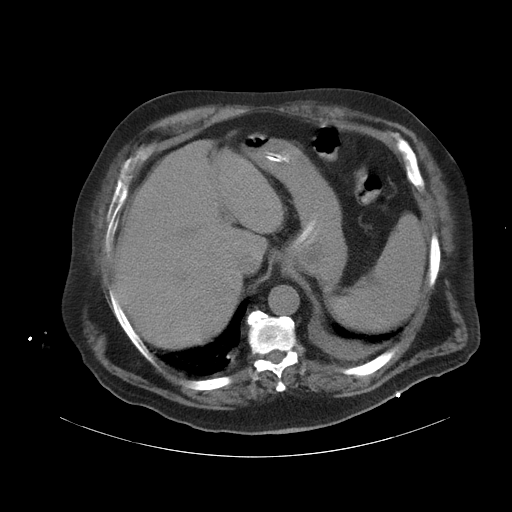
[im 52/67  soft-tissue]
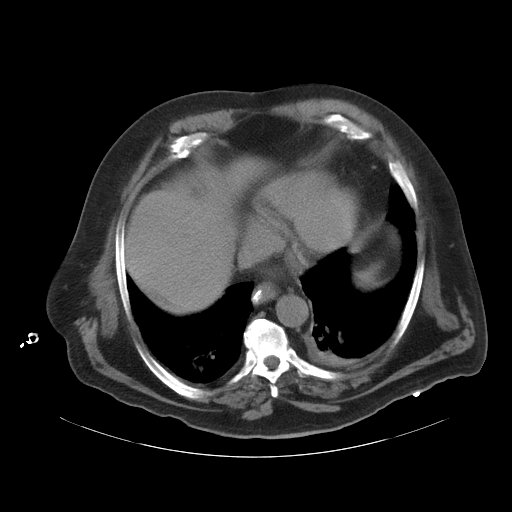
[im 61/67  soft-tissue]
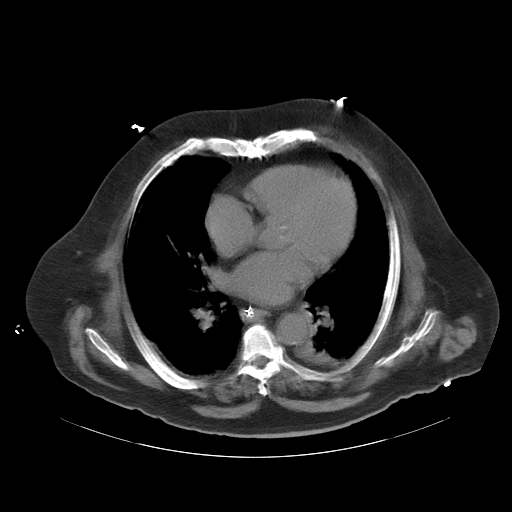

[Series 203: coronals, idose (2) · coronal · 0.45mm/px · 3 of 159 slices shown]
[im 53/159  soft-tissue]
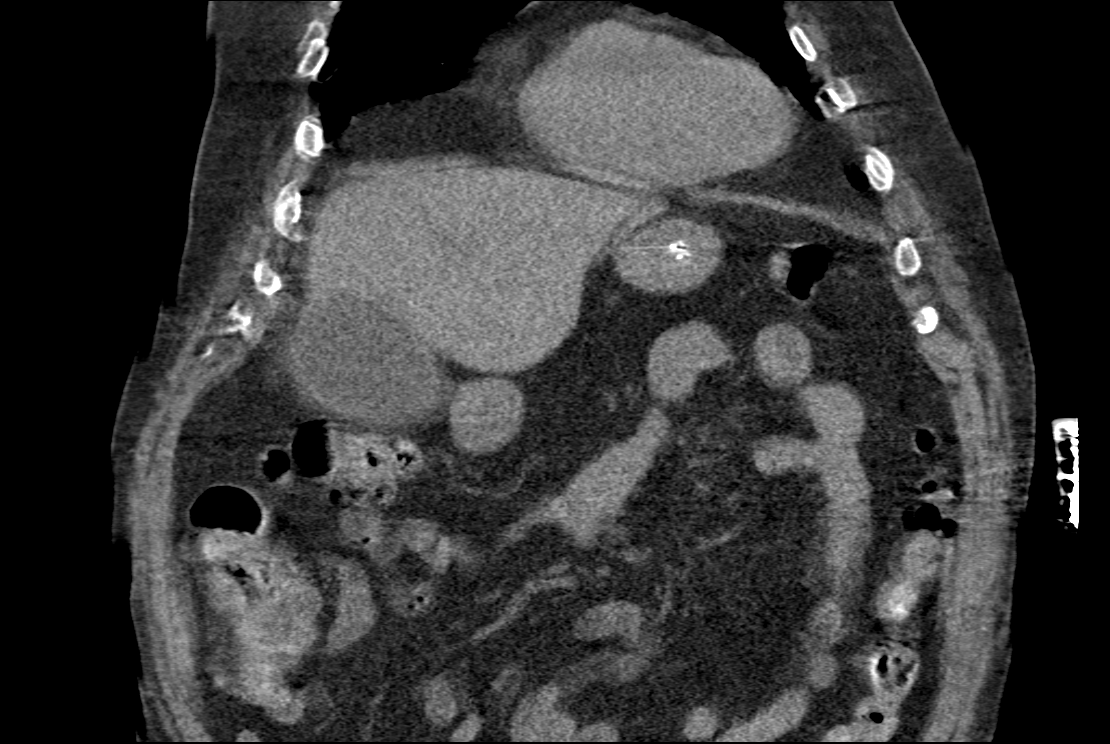
[im 71/159  soft-tissue]
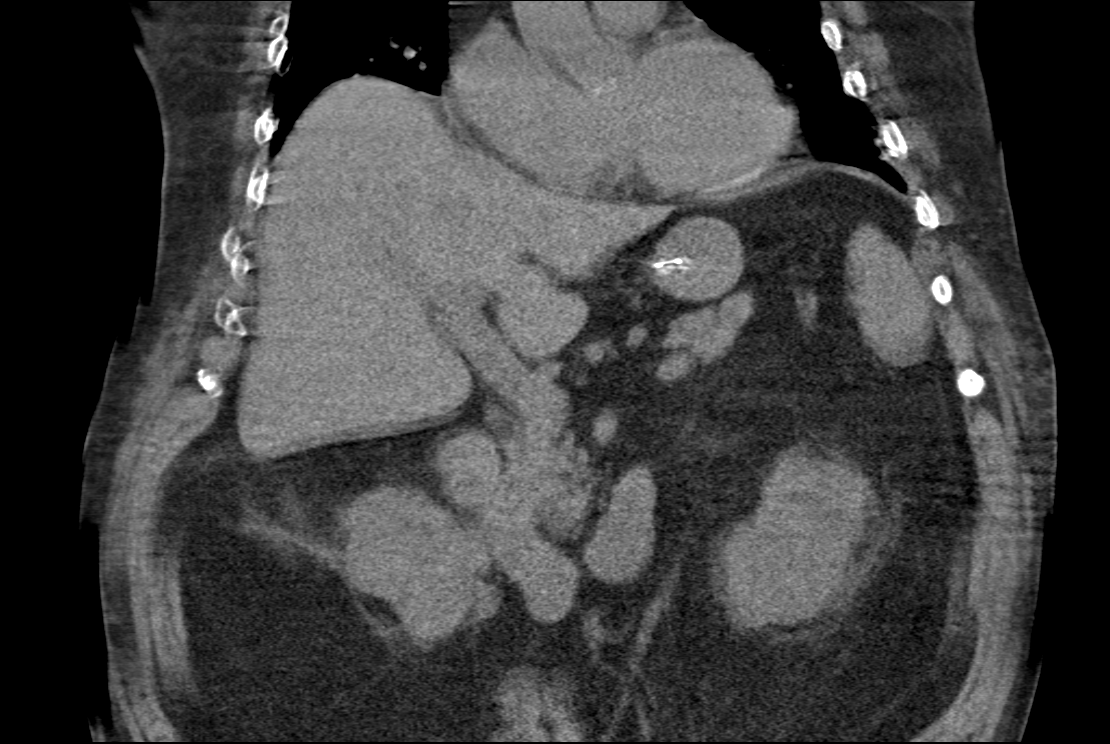
[im 88/159  soft-tissue]
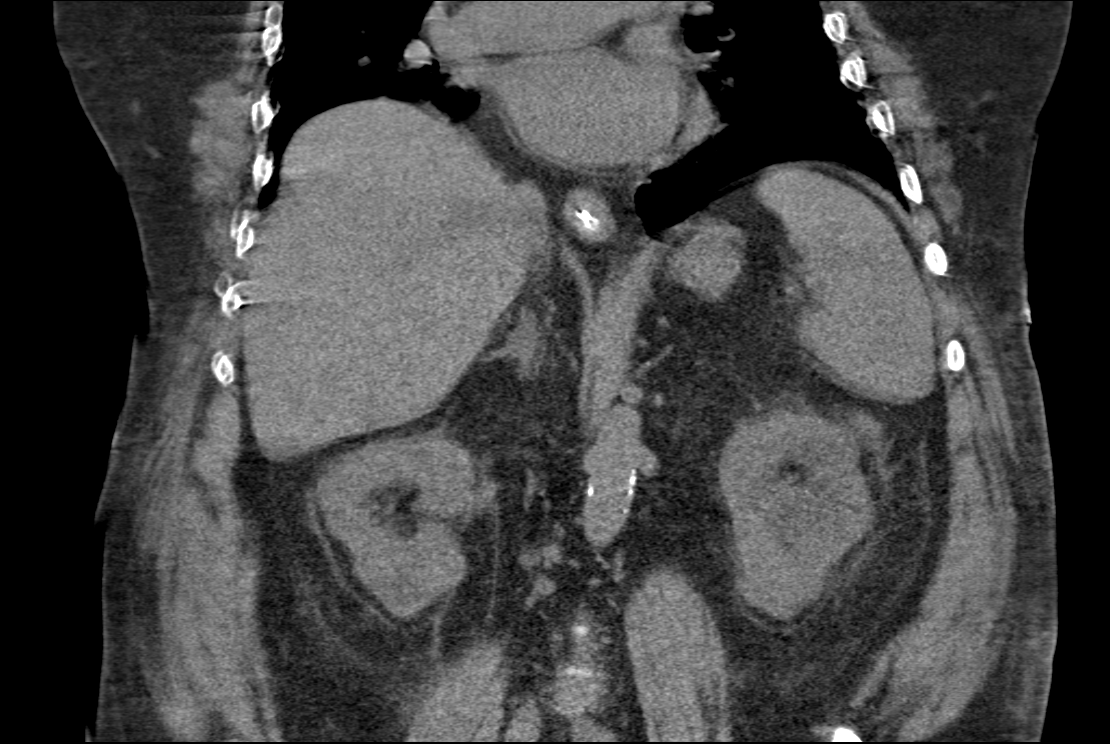

[10 of 46 positions shown; findings below may reference images not displayed]

FINDINGS: The lack of intravenous contrast limits the ability to evaluate
solid abdominal organs.

Lower chest: Limited visualization of lower thorax demonstrates
minimal bibasilar dependent subsegmental atelectasis. No focal
airspace opacities. No pleural effusion.

Normal heart size. Post median sternotomy. Coronary artery
calcifications.

Hepatobiliary: Normal hepatic contour. Punctate hypoattenuating
hepatic lesions are incompletely characterized on this noncontrast
examination though in the absence of a known primary malignancy are
favored to represent hepatic cysts.

Pancreas: Normal noncontrast appearance of the pancreas

Spleen: Normal noncontrast appearance of the spleen

Adrenals/Urinary Tract: No renal stones. Note is made of a exophytic
hypoattenuating lesion arising from the anterior superior aspect of
the right kidney which measures approximately 2.7 cm in diameter as
well as an exophytic approximately 2.3 cm lesion arising from the
interpolar aspect of the left kidney, both of which are incompletely
characterized without a venous contrast though likely representative
of renal cysts. No urinary obstruction. No renal stones are seen
along expected course of either ureter or the urinary bladder.
Normal appearance of the urinary bladder given underdistention.

Normal noncontrast appearance of the bilateral adrenal glands.

Brachytherapy seeds are seen within the prostate.

Stomach/Bowel: The mid body of the stomach is well apposed against
the ventral wall of the abdomen without evidence of interposed
colon. Scattered colonic diverticulosis without definite evidence
superimposed acute diverticulitis on this noncontrast examination.
The colon is underdistended however there may be a minimal amount of
rather diffuse colonic wall thickening. No evidence of enteric
obstruction. Normal noncontrast appearance of the terminal ileum.
The appendix is not visualized. A small bowel anastomosis is seen
within the right lower abdominal quadrant without evidence of
enteric obstruction. No pneumoperitoneum, pneumatosis or portal
venous gas.

Vascular/Lymphatic: Large amounts of irregular mixed calcified and
noncalcified atherosclerotic plaque throughout the abdominal aorta.
The abdominal aorta is mildly ectatic about its mid aspect measuring
2.4 cm in diameter. Several contained penetrating ulcers on noted
about the right (image 42, series 2) and left (image 48, series 2)
sides of the infrarenal abdominal aorta. No definitive periaortic
stranding.

No bulky retroperitoneal, mesenteric, pelvic or inguinal
lymphadenopathy.

Other: Small mesenteric fat containing periumbilical and bilateral
inguinal hernias. Bilateral mesenteric fat containing inguinal
hernias. Small mesenteric fat containing periumbilical hernia.

Musculoskeletal: No acute or aggressive osseous abnormalities.
Mild-to-moderate multilevel lumbar spine DDD, worse at L2-L3 and
L4-L5 with disc space height loss, endplate irregularity and
sclerosis.
IMPRESSION: 1. Anatomy amenable to potential percutaneous gastrostomy tube
placement.
2. Diffuse colonic wall thickening, potentially accentuated due to
underdistention though could be seen in the setting of an enteritis.
Clinical correlation is advised. No evidence of enteric obstruction.
3. Colonic diverticulosis without evidence of diverticulitis.
4.  Aortic Atherosclerosis ([id number]-170.0)

## 2022-09-06 ENCOUNTER — Telehealth: Payer: Self-pay | Admitting: Nurse Practitioner

## 2022-09-06 NOTE — Telephone Encounter (Signed)
Looked at patients chart and patient has never been seen before here. Patient is not even been seen in hospital by any of our doctors. No referral placed for them to see [place]. Called [employer] and gave them this information. Told them I'm under of who wrote the order for supplies but not our office. Nothing further needed

## 2022-09-06 NOTE — Telephone Encounter (Signed)
Samuel Baldwin from [employer] calling re: a fax sent on Dec 14th Detailed Region Order for supplies. Our PA needs to sign and date and return.    [phone] is her Direct line. Her fax is [phone]
